# Patient Record
Sex: Male | Born: 1965 | Race: White | Hispanic: No | Marital: Married | Smoking: Never smoker
Health system: Southern US, Community
[De-identification: ages and names within clinical notes are randomized; demographics above are authoritative.]

---

## 2008-06-05 ENCOUNTER — Ambulatory Visit: Payer: Self-pay | Admitting: Family Medicine

## 2008-06-05 DIAGNOSIS — J0100 Acute maxillary sinusitis, unspecified: Secondary | ICD-10-CM

## 2008-07-13 ENCOUNTER — Encounter: Payer: Self-pay | Admitting: Family Medicine

## 2017-09-23 ENCOUNTER — Emergency Department (HOSPITAL_BASED_OUTPATIENT_CLINIC_OR_DEPARTMENT_OTHER): Payer: Non-veteran care

## 2017-09-23 ENCOUNTER — Encounter (HOSPITAL_BASED_OUTPATIENT_CLINIC_OR_DEPARTMENT_OTHER): Payer: Self-pay | Admitting: *Deleted

## 2017-09-23 ENCOUNTER — Other Ambulatory Visit: Payer: Self-pay

## 2017-09-23 ENCOUNTER — Emergency Department (HOSPITAL_BASED_OUTPATIENT_CLINIC_OR_DEPARTMENT_OTHER)
Admission: EM | Admit: 2017-09-23 | Discharge: 2017-09-23 | Disposition: A | Discharge status: Home / Self Care | Payer: Non-veteran care | Attending: Emergency Medicine | Admitting: Emergency Medicine

## 2017-09-23 DIAGNOSIS — Y929 Unspecified place or not applicable: Secondary | ICD-10-CM | POA: Insufficient documentation

## 2017-09-23 DIAGNOSIS — Y998 Other external cause status: Secondary | ICD-10-CM | POA: Insufficient documentation

## 2017-09-23 DIAGNOSIS — T148XXA Other injury of unspecified body region, initial encounter: Secondary | ICD-10-CM

## 2017-09-23 DIAGNOSIS — Y93E5 Activity, floor mopping and cleaning: Secondary | ICD-10-CM | POA: Diagnosis not present

## 2017-09-23 DIAGNOSIS — W458XXA Other foreign body or object entering through skin, initial encounter: Secondary | ICD-10-CM | POA: Diagnosis not present

## 2017-09-23 DIAGNOSIS — S61242A Puncture wound with foreign body of right middle finger without damage to nail, initial encounter: Secondary | ICD-10-CM | POA: Insufficient documentation

## 2017-09-23 DIAGNOSIS — S60452A Superficial foreign body of right middle finger, initial encounter: Secondary | ICD-10-CM

## 2017-09-23 MED ORDER — CEPHALEXIN 500 MG PO CAPS: 500.0000 mg | ORAL_CAPSULE | Freq: Four times a day (QID) | ORAL | 0 refills | Status: AC

## 2017-09-23 MED ORDER — LIDOCAINE HCL (PF) 1 % IJ SOLN
10.0000 mL | Freq: Once | INTRAMUSCULAR | Status: AC
  Administered 2017-09-23: 10 mL
  Filled 2017-09-23: qty 10

## 2017-09-23 NOTE — ED Notes (Signed)
Pt verbalizes understanding of d/c instructions and denies any further needs at this time. 

## 2017-09-23 NOTE — Discharge Instructions (Addendum)
Please take full course of antibiotics as directed.  Soak finger in clean warm water twice daily, keep covered clean and dry.  Please follow-up with Dr. Izora Ribas with hand surgery for continued management.  Return to the ED for worsening pain, redness, swelling, drainage or any other new or concerning symptoms.

## 2017-09-23 NOTE — ED Triage Notes (Signed)
Pt c/o right middle finger foreign body x 1 hr

## 2017-09-23 NOTE — ED Provider Notes (Signed)
[facility] EMERGENCY DEPARTMENT Provider Note   CSN: [id number] Arrival date & time: 09/23/17  1356     History   Chief Complaint Chief Complaint  Patient presents with  . Hand Injury    HPI Dean Clark is a 52 y.o. male.  Dean Clark is a 52 y.o. Male who is otherwise healthy, presents to the ED for evaluation of a foreign body in his right middle finger. Pt reports about an hour before arrival he was wiping down an old wood floor when I piece of wood stuck through the proximal segment of his right middle finger, pt reports it almost came all the way through the other side, and it still feels like there is a long piece of wood stuck in the finger that he has been unable to get out. Pt able to bend finger w/o difficulty although it is painful. Pt reports he is up to date on tetanus.     History reviewed. No pertinent past medical history.  Patient Active Problem List   Diagnosis Date Noted  . ACUTE MAXILLARY SINUSITIS 06/05/2008    History reviewed. No pertinent surgical history.      Home Medications    Prior to Admission medications   Medication Sig Start Date End Date Taking? Authorizing Provider  cephALEXin (KEFLEX) 500 MG capsule Take 1 capsule (500 mg total) by mouth 4 (four) times daily for 5 days. 09/23/17 09/28/17  Dartha Lodge, PA-C    Family History History reviewed. No pertinent family history.  Social History Social History   Tobacco Use  . Smoking status: Never Smoker  . Smokeless tobacco: Never Used  Substance Use Topics  . Alcohol use: Yes    Comment: occ  . Drug use: Never     Allergies   Patient has no known allergies.   Review of Systems Review of Systems  Constitutional: Negative for chills and fever.  Musculoskeletal: Positive for arthralgias.  Skin: Positive for wound (foreign body ).  Neurological: Negative for weakness and numbness.     Physical Exam Updated Vital Signs BP (!) 126/100 (BP Location:  Left Arm)   Pulse 84   Temp 98.4 F (36.9 C) (Oral)   Resp 16   Ht  (1.753 m)   Wt 95.3 kg (210 lb)   SpO2 100%   BMI 31.01 kg/m   Physical Exam  Constitutional: He appears well-developed and well-nourished. No distress.  HENT:  Head: Normocephalic and atraumatic.  Eyes: Right eye exhibits no discharge. Left eye exhibits no discharge.  Pulmonary/Chest: Effort normal. No respiratory distress.  Musculoskeletal:  Palpable foreign body in the proximal segment of the right middle finger, though FB is not visible, entrance and exit wounds noted on either side of finger just below PIP line. Sensation intact, good cap refill  Neurological: He is alert. Coordination normal.  Skin: He is not diaphoretic.  Psychiatric: He has a normal mood and affect. His behavior is normal.  Nursing note and vitals reviewed.    ED Treatments / Results  Labs (all labs ordered are listed, but only abnormal results are displayed) Labs Reviewed - No data to display  EKG None  Radiology Dg Finger Middle Right  Result Date: 09/23/2017 CLINICAL DATA:  Wooden splinter in the middle finger today. EXAM: RIGHT MIDDLE FINGER 2+V COMPARISON:  None. FINDINGS: No visible opaque foreign body. No fracture or malalignment. No soft tissue gas. IMPRESSION: No visible foreign body. Note that wooden foreign bodies are usually radiopaque. No  osseous abnormality. Electronically Signed   By: Marnee Spring M.D.   On: 09/23/2017 15:59    Procedures .Foreign Body Removal Date/Time: 09/23/2017 5:54 PM Performed by: Dartha Lodge, PA-C Authorized by: Dartha Lodge, PA-C  Consent: Verbal consent obtained. Risks and benefits: risks, benefits and alternatives were discussed Consent given by: patient Patient understanding: patient states understanding of the procedure being performed Imaging studies: imaging studies available Patient identity confirmed: verbally with patient Body area: skin General location: upper  extremity Location details: right long finger Anesthesia: local infiltration  Anesthesia: Local Anesthetic: lidocaine 1% without epinephrine Patient cooperative: yes Localization method: probed and ultrasound Removal mechanism: forceps, scalpel and irrigation Dressing: antibiotic ointment and dressing applied Depth: subcutaneous Complexity: simple 1 objects recovered. Objects recovered: Removed small piece of wood but more remains Post-procedure assessment: residual foreign bodies remain Patient tolerance: Patient tolerated the procedure well with no immediate complications   (including critical care time)  Medications Ordered in ED Medications  lidocaine (PF) (XYLOCAINE) 1 % injection 10 mL (10 mLs Infiltration Given by Other 09/23/17 1705)     Initial Impression / Assessment and Plan / ED Course  I have reviewed the triage vital signs and the nursing notes.  Pertinent labs & imaging results that were available during my care of the patient were reviewed by me and considered in my medical decision making (see chart for details).  Patient presents to the ED for evaluation of foreign body in the right middle finger.  Patient reports he got a piece of wood in the finger while wiping down an indoor floor earlier today.   On exam vitals are normal patient is in no acute distress.  Tetanus vaccine is up-to-date.  X-ray obtained and shows no evidence of radiopaque foreign body, no underlying fracture or bony disturbance.  Foreign body is palpable but not visualized will anesthetized the area with epinephrine and attempt to remove foreign body.  Small incisions made at both entrance and exit wound, but unable to clearly visualize the foreign body or grip it with hemostatic forceps.  Ultrasound does show evidence of hyperechoic material. After multiple and a successful attempts will have patient start a course of Keflex for infection prophylaxis, encourage warm soaks, and he will need to  follow-up with Dr. Izora Ribas with hand surgery for removal of foreign body.  Strict return precautions discussed.  Patient expresses understanding and is in agreement with plan.  Pt discussed with Dr. Charm Barges, who is in agreement with plan.  Final Clinical Impressions(s) / ED Diagnoses   Final diagnoses:  Foreign body of right middle finger  Splinter    ED Discharge Orders        Ordered    cephALEXin (KEFLEX) 500 MG capsule  4 times daily     09/23/17 1801       Dartha Lodge, PA-C 09/24/17 0001    Terrilee Files, MD 09/25/17 [phone]

## 2019-05-12 IMAGING — CR DG FINGER MIDDLE 2+V*R*
3 series · 3 of 3 positions shown · non-contrast
Comparison: None.

CLINICAL DATA: Ingunn Harpa Ronlor in the middle finger today.

EXAM:
RIGHT MIDDLE FINGER 2+V

[x finger pa right]
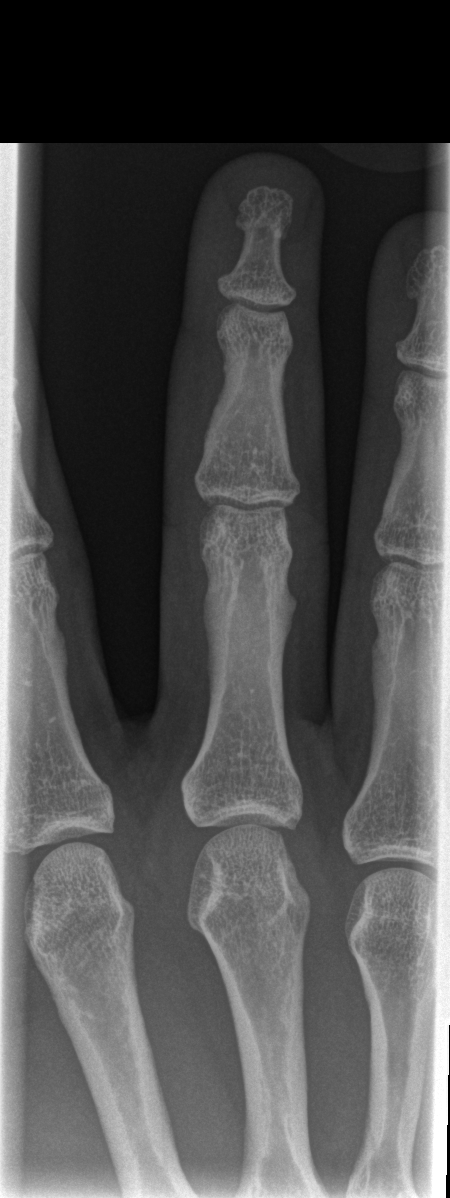

[x finger obl. right]
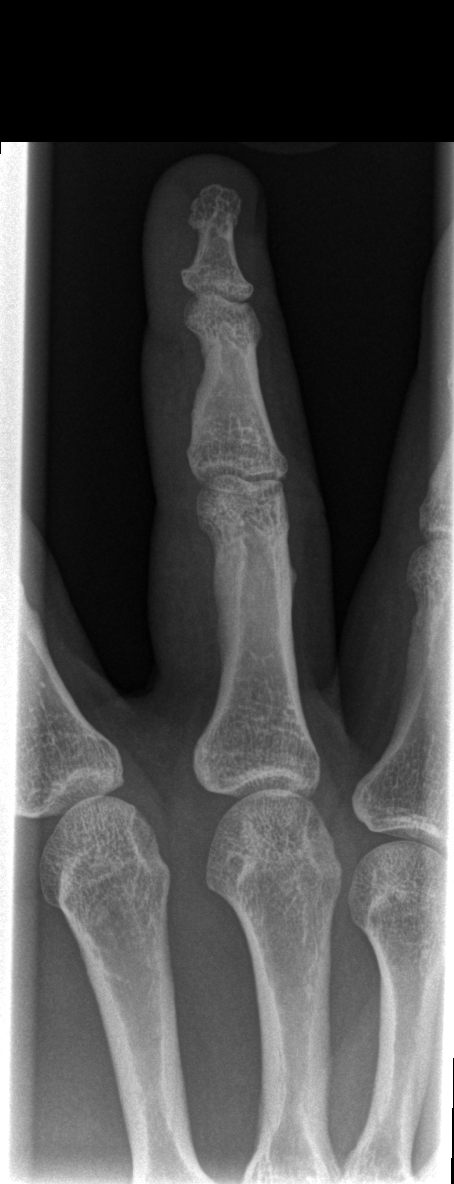

[x finger lateral right]
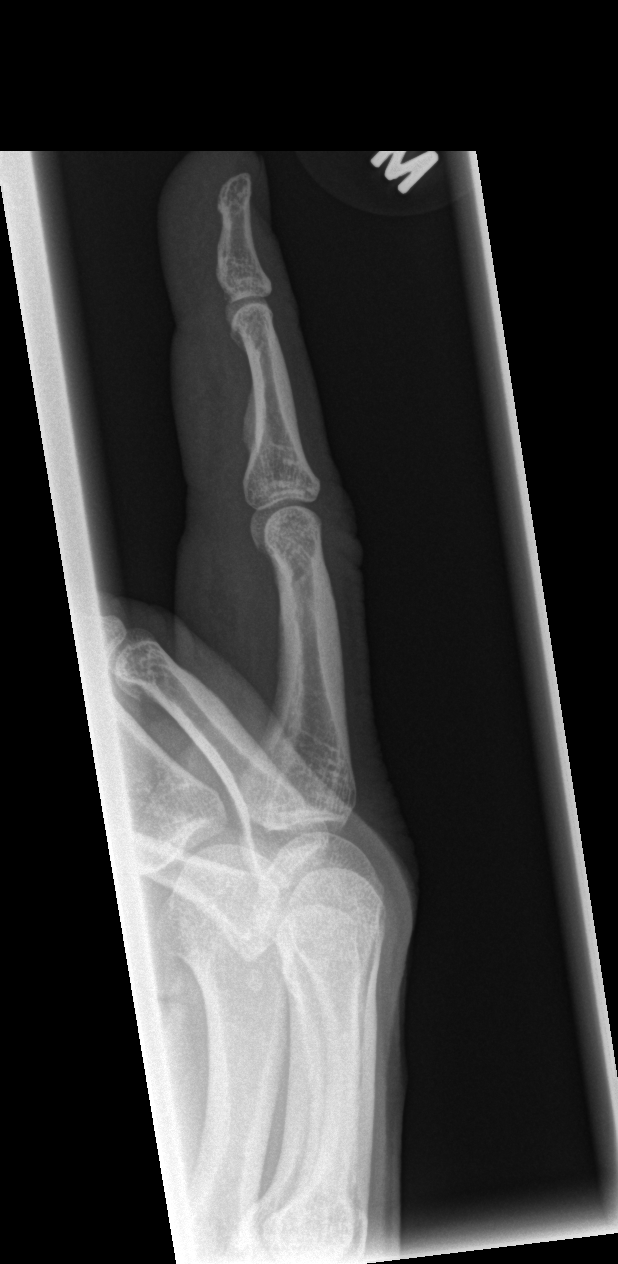

[3 of 3 positions shown; findings below may reference images not displayed]

FINDINGS: No visible opaque foreign body. No fracture or malalignment. No soft
tissue gas.
IMPRESSION: No visible foreign body. Note that Douglas K foreign bodies are usually
radiopaque. No osseous abnormality.
